# Patient Record
Sex: Male | Born: 1975 | Race: White | Hispanic: No | Marital: Married | Smoking: Former smoker
Health system: Southern US, Community
[De-identification: ages and names within clinical notes are randomized; demographics above are authoritative.]

## PROBLEM LIST (undated history)

## (undated) DIAGNOSIS — I10 Essential (primary) hypertension: Secondary | ICD-10-CM

## (undated) DIAGNOSIS — E785 Hyperlipidemia, unspecified: Secondary | ICD-10-CM

## (undated) HISTORY — PX: WRIST FRACTURE SURGERY: SHX121

## (undated) HISTORY — PX: KNEE ARTHROSCOPY: SUR90

---

## 2020-04-28 ENCOUNTER — Emergency Department (HOSPITAL_BASED_OUTPATIENT_CLINIC_OR_DEPARTMENT_OTHER)
Admission: EM | Admit: 2020-04-28 | Discharge: 2020-04-28 | Disposition: A | Discharge status: Home / Self Care | Payer: Managed Care, Other (non HMO) | Attending: Emergency Medicine | Admitting: Emergency Medicine

## 2020-04-28 ENCOUNTER — Encounter (HOSPITAL_BASED_OUTPATIENT_CLINIC_OR_DEPARTMENT_OTHER): Payer: Self-pay | Admitting: *Deleted

## 2020-04-28 ENCOUNTER — Emergency Department (HOSPITAL_BASED_OUTPATIENT_CLINIC_OR_DEPARTMENT_OTHER): Payer: Managed Care, Other (non HMO)

## 2020-04-28 ENCOUNTER — Other Ambulatory Visit: Payer: Self-pay

## 2020-04-28 DIAGNOSIS — R0602 Shortness of breath: Secondary | ICD-10-CM | POA: Diagnosis present

## 2020-04-28 DIAGNOSIS — R0981 Nasal congestion: Secondary | ICD-10-CM | POA: Insufficient documentation

## 2020-04-28 DIAGNOSIS — Z87891 Personal history of nicotine dependence: Secondary | ICD-10-CM | POA: Insufficient documentation

## 2020-04-28 DIAGNOSIS — I10 Essential (primary) hypertension: Secondary | ICD-10-CM | POA: Insufficient documentation

## 2020-04-28 DIAGNOSIS — R0600 Dyspnea, unspecified: Secondary | ICD-10-CM | POA: Diagnosis not present

## 2020-04-28 HISTORY — DX: Hyperlipidemia, unspecified: E78.5

## 2020-04-28 HISTORY — DX: Essential (primary) hypertension: I10

## 2020-04-28 LAB — COMPREHENSIVE METABOLIC PANEL
ALT: 54 U/L — ABNORMAL HIGH (ref 0–44)
AST: 56 U/L — ABNORMAL HIGH (ref 15–41)
Albumin: 4.6 g/dL (ref 3.5–5.0)
Alkaline Phosphatase: 87 U/L (ref 38–126)
Anion gap: 13 (ref 5–15)
BUN: 12 mg/dL (ref 6–20)
CO2: 25 mmol/L (ref 22–32)
Calcium: 9.4 mg/dL (ref 8.9–10.3)
Chloride: 101 mmol/L (ref 98–111)
Creatinine, Ser: 0.93 mg/dL (ref 0.61–1.24)
GFR calc Af Amer: >60 mL/min (ref >60)
GFR calc non Af Amer: >60 mL/min (ref >60)
Glucose, Bld: 114 mg/dL — ABNORMAL HIGH (ref 70–99)
Potassium: 4 mmol/L (ref 3.5–5.1)
Sodium: 139 mmol/L (ref 135–145)
Total Bilirubin: 0.5 mg/dL (ref 0.3–1.2)
Total Protein: 8.3 g/dL — ABNORMAL HIGH (ref 6.5–8.1)

## 2020-04-28 LAB — CBC WITH DIFFERENTIAL/PLATELET
Abs Immature Granulocytes: 0.04 10*3/uL (ref 0.00–0.07)
Basophils Absolute: 0.1 10*3/uL (ref 0.0–0.1)
Basophils Relative: 1 %
Eosinophils Absolute: 0 10*3/uL (ref 0.0–0.5)
Eosinophils Relative: 0 %
HCT: 43.5 % (ref 39.0–52.0)
Hemoglobin: 14.9 g/dL (ref 13.0–17.0)
Immature Granulocytes: 0 %
Lymphocytes Relative: 27 %
Lymphs Abs: 2.8 10*3/uL (ref 0.7–4.0)
MCH: 32.5 pg (ref 26.0–34.0)
MCHC: 34.3 g/dL (ref 30.0–36.0)
MCV: 94.8 fL (ref 80.0–100.0)
Monocytes Absolute: 0.8 10*3/uL (ref 0.1–1.0)
Monocytes Relative: 8 %
Neutro Abs: 6.4 10*3/uL (ref 1.7–7.7)
Neutrophils Relative %: 64 %
Platelets: 250 10*3/uL (ref 150–400)
RBC: 4.59 MIL/uL (ref 4.22–5.81)
RDW: 13.3 % (ref 11.5–15.5)
WBC: 10.2 10*3/uL (ref 4.0–10.5)
nRBC: 0 % (ref 0.0–0.2)

## 2020-04-28 LAB — BRAIN NATRIURETIC PEPTIDE: B Natriuretic Peptide: 22 pg/mL (ref 0.0–100.0)

## 2020-04-28 LAB — D-DIMER, QUANTITATIVE: D-Dimer, Quant: 0.36 ug/mL-FEU (ref 0.00–0.50)

## 2020-04-28 LAB — TROPONIN I (HIGH SENSITIVITY): Troponin I (High Sensitivity): 4 ng/L (ref <18)

## 2020-04-28 MED ORDER — ALBUTEROL SULFATE HFA 108 (90 BASE) MCG/ACT IN AERS: 1.0000 | INHALATION_SPRAY | Freq: Four times a day (QID) | RESPIRATORY_TRACT | 0 refills | Status: AC | PRN

## 2020-04-28 NOTE — ED Notes (Signed)
Patient transported to X-ray 

## 2020-04-28 NOTE — ED Triage Notes (Addendum)
Pt c/o SOb with exertion x 4 days, sent here by PMD  For PE eval

## 2020-04-28 NOTE — Discharge Instructions (Addendum)
Per our discussion, I would continue taking your loratadine once a day.  I would also purchase Flonase.  You can buy this over-the-counter.  You can also find the generic version which is much cheaper.  Take this once to twice per day in each nostril.  I would recommend following up with your primary care provider regarding your symptoms as well as visit.  If you find that your symptoms have not improved in 1 week you should give them a call to set up an appointment for further evaluation.  You can always return to the emergency department for reevaluation.  It was a pleasure to meet you.

## 2020-04-28 NOTE — ED Provider Notes (Signed)
[facility] EMERGENCY DEPARTMENT Provider Note   CSN: [id number] Arrival date & time: 04/28/20  1514     History Chief Complaint  Patient presents with  . Shortness of Breath    Jonathan Gillespie is a 44 y.o. male.  HPI Patient is a 44 year old male with a medical history as noted below.  Patient states for the last 2 to 3 days he has been experiencing very mild dyspnea on exertion.  He is very active and notes that when swimming or doing his martial arts class he felt "more winded than normal".  He denies any significant shortness of breath but states that "he just feels like he cannot take a full breath".  Last night he felt mildly congested and was having difficulty breathing through his nose.  He was concerned that he could have contracted COVID-19 and attempted to be seen by his PCP today but could not schedule an appointment, so he went to a local urgent care.  There was concern at this local urgent care that his symptoms could be consistent with a pulmonary embolism so he was sent to the emergency department.  He did have a rapid COVID-19 test which was negative.  Patient has no other complaints at this time.  No fevers, chills, chest pain, abdominal pain, syncope.     Past Medical History:  Diagnosis Date  . Hyperlipidemia   . Hypertension     There are no problems to display for this patient.   Past Surgical History:  Procedure Laterality Date  . KNEE ARTHROSCOPY    . WRIST FRACTURE SURGERY         History reviewed. No pertinent family history.  Social History   Tobacco Use  . Smoking status: Former Games developer  . Smokeless tobacco: Never Used  Substance Use Topics  . Alcohol use: Not Currently    Alcohol/week: 3.0 standard drinks    Types: 3 Cans of beer per week  . Drug use: Not Currently    Home Medications Prior to Admission medications   Not on File    Allergies    Patient has no known allergies.  Review of Systems   Review of Systems    Constitutional: Negative for chills and fever.  HENT: Positive for congestion. Negative for sinus pressure, sinus pain and sore throat.   Respiratory: Positive for shortness of breath.   Cardiovascular: Negative for chest pain and leg swelling.  Gastrointestinal: Negative for abdominal pain, nausea and vomiting.   Physical Exam Updated Vital Signs BP (!) 155/98   Pulse 91   Temp 98.8 F (37.1 C) (Oral)   Resp 18   Ht 5\' 6"  (1.676 m)   Wt 99.8 kg   SpO2 100%   BMI 35.51 kg/m   Physical Exam Vitals and nursing note reviewed.  Constitutional:      General: He is not in acute distress.    Appearance: Normal appearance. He is well-developed. He is not ill-appearing, toxic-appearing or diaphoretic.  HENT:     Head: Normocephalic and atraumatic.     Comments: Mildly edematous nasal turbinates noted bilaterally.  No congestion.    Right Ear: External ear normal.     Left Ear: External ear normal.     Nose: Nose normal.     Mouth/Throat:     Mouth: Mucous membranes are moist.     Pharynx: Oropharynx is clear. No oropharyngeal exudate or posterior oropharyngeal erythema.  Eyes:     Extraocular Movements: Extraocular movements intact.  Cardiovascular:     Rate and Rhythm: Normal rate and regular rhythm.     Pulses: Normal pulses.     Heart sounds: Normal heart sounds. No murmur heard.  No friction rub. No gallop.      Comments: Heart is regular rate and rhythm.  Patient is not tachycardic. Pulmonary:     Effort: Pulmonary effort is normal. No tachypnea, accessory muscle usage or respiratory distress.     Breath sounds: Normal breath sounds. No stridor. No decreased breath sounds, wheezing, rhonchi or rales.     Comments: Lungs are clear to auscultation bilaterally.  No tachypnea.  Patient is saturating at 99% while I am in the room and speaking clear in complete sentences. Abdominal:     General: Abdomen is flat.     Palpations: Abdomen is soft.  Musculoskeletal:         General: Normal range of motion.     Cervical back: Normal range of motion and neck supple. No tenderness.     Right lower leg: No tenderness. No edema.     Left lower leg: No tenderness. No edema.     Comments: No edema appreciated the bilateral lower extremities.  No calf pain.  Negative Homans' sign.  Skin:    General: Skin is warm and dry.  Neurological:     General: No focal deficit present.     Mental Status: He is alert and oriented to person, place, and time.  Psychiatric:        Mood and Affect: Mood normal.        Behavior: Behavior normal.    ED Results / Procedures / Treatments   Labs (all labs ordered are listed, but only abnormal results are displayed) Labs Reviewed  COMPREHENSIVE METABOLIC PANEL - Abnormal; Notable for the following components:      Result Value   Glucose, Bld 114 (*)    Total Protein 8.3 (*)    AST 56 (*)    ALT 54 (*)    All other components within normal limits  CBC WITH DIFFERENTIAL/PLATELET  D-DIMER, QUANTITATIVE (NOT AT [facility])  BRAIN NATRIURETIC PEPTIDE  TROPONIN I (HIGH SENSITIVITY)   EKG None  Radiology DG Chest 2 View  Result Date: 04/28/2020 CLINICAL DATA:  Sob on inspiration x 2 days Former smoker No sx/ ca Kizzie Furnish EXAM: CHEST - 2 VIEW COMPARISON:  None. FINDINGS: The heart size and mediastinal contours are within normal limits. Mild interstitial prominence bilaterally. No focal consolidation. No pneumothorax or pleural effusion. The visualized skeletal structures are unremarkable. IMPRESSION: Mild interstitial prominence bilaterally, which could represent chronic changes from prior smoking. Mild acute bronchitis not excluded. Electronically Signed   By: Emmaline Kluver M.D.   On: 04/28/2020 16:00    Procedures Procedures   Medications Ordered in ED Medications - No data to display  ED Course  I have reviewed the triage vital signs and the nursing notes.  Pertinent labs & imaging results that were available during my care of  the patient were reviewed by me and considered in my medical decision making (see chart for details).  Clinical Course as of Apr 28 1834  Wed Apr 28, 2020  1738 Troponin I (High Sensitivity): 4 [LJ]  1738 D-Dimer, Quant: 0.36 [LJ]  1738 B Natriuretic Peptide: 22.0 [LJ]  1758 AST(!): 56 [LJ]  1759 ALT(!): 54 [LJ]  1834 Mild interstitial prominence bilaterally, which could represent chronic changes from prior smoking. Mild acute bronchitis not excluded.  Patient is a former  smoker and reports cessation from smoking 20 years ago.  DG Chest 2 View [LJ]    Clinical Course User Index [LJ] Placido Sou, PA-C   MDM Rules/Calculators/A&P                          Pt is a 44 y.o. male that present with a history, physical exam, ED Clinical Course as noted above.   Patient presents today due to very mild DOE.  Patient was initially seen at an urgent care and sent to the emergency department for PE rule out.  Labs obtained in triage are extremely reassuring.  No elevation in his troponin.  No elevation his D-dimer.  No elevation in his BNP.  His vital signs are stable.  He is not hypoxic.  He is not tachycardic.  Patient does have a mildly elevated AST of 56 and ALT of 54.  Patient states he has been told in the past that he has elevated liver enzymes and is currently working on exercise and weight loss.  He does drink 3-4 beers per night.  We discussed cutting back on his alcohol consumption.  Patient was very anxious regarding his symptoms.  I did note some mild edema of his nasal turbinates bilaterally.  Patient currently take loratadine.  Recommended that he begin taking Flonase as well.  We will also prescribe him an albuterol inhaler to see if this helps with his symptoms.  He is planning on following up with his primary care provider regarding this visit and has a 59-month appointment scheduled in a few weeks.  His questions were answered and he was amicable at the time of discharge.  He  has been mildly hypertensive throughout his stay today but otherwise his vital signs are stable.  Patient discharged to home/self care.  Condition at discharge: Stable  Note: Portions of this report may have been transcribed using voice recognition software. Every effort was made to ensure accuracy; however, inadvertent computerized transcription errors may be present.   Final Clinical Impression(s) / ED Diagnoses Final diagnoses:  Dyspnea, unspecified type    Rx / DC Orders ED Discharge Orders         Ordered    albuterol (VENTOLIN HFA) 108 (90 Base) MCG/ACT inhaler  Every 6 hours PRN     Discontinue  Reprint     04/28/20 1829           Placido Sou, PA-C 04/28/20 1835    Raeford Razor, MD 04/29/20 1919

## 2021-04-22 IMAGING — DX DG CHEST 2V
2 series · 2 of 2 positions shown · non-contrast
Comparison: None.

CLINICAL DATA: Sob on inspiration x 2 days Former smoker No sx/ ca
/injury

EXAM:
CHEST - 2 VIEW

[chest pa]
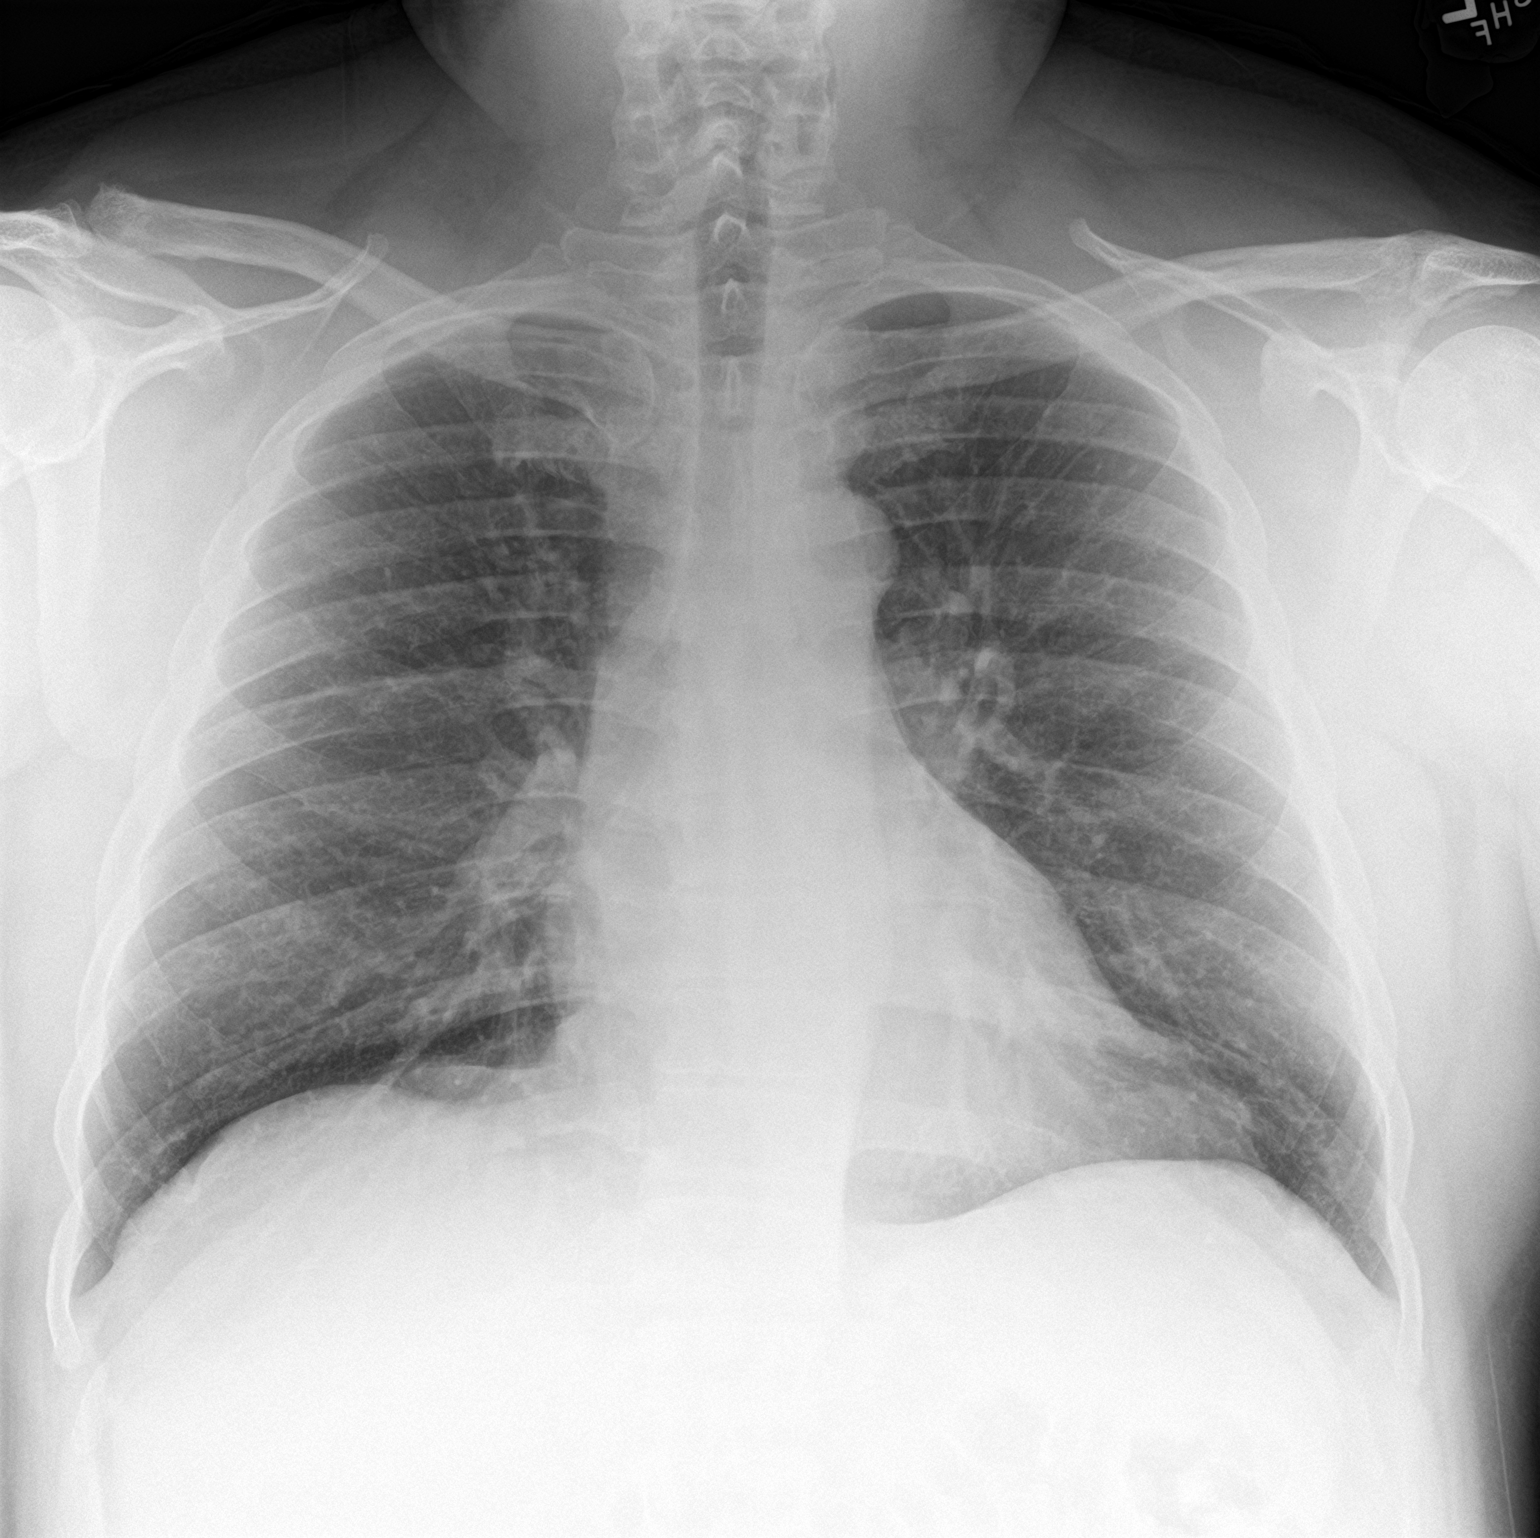

[chest lat]
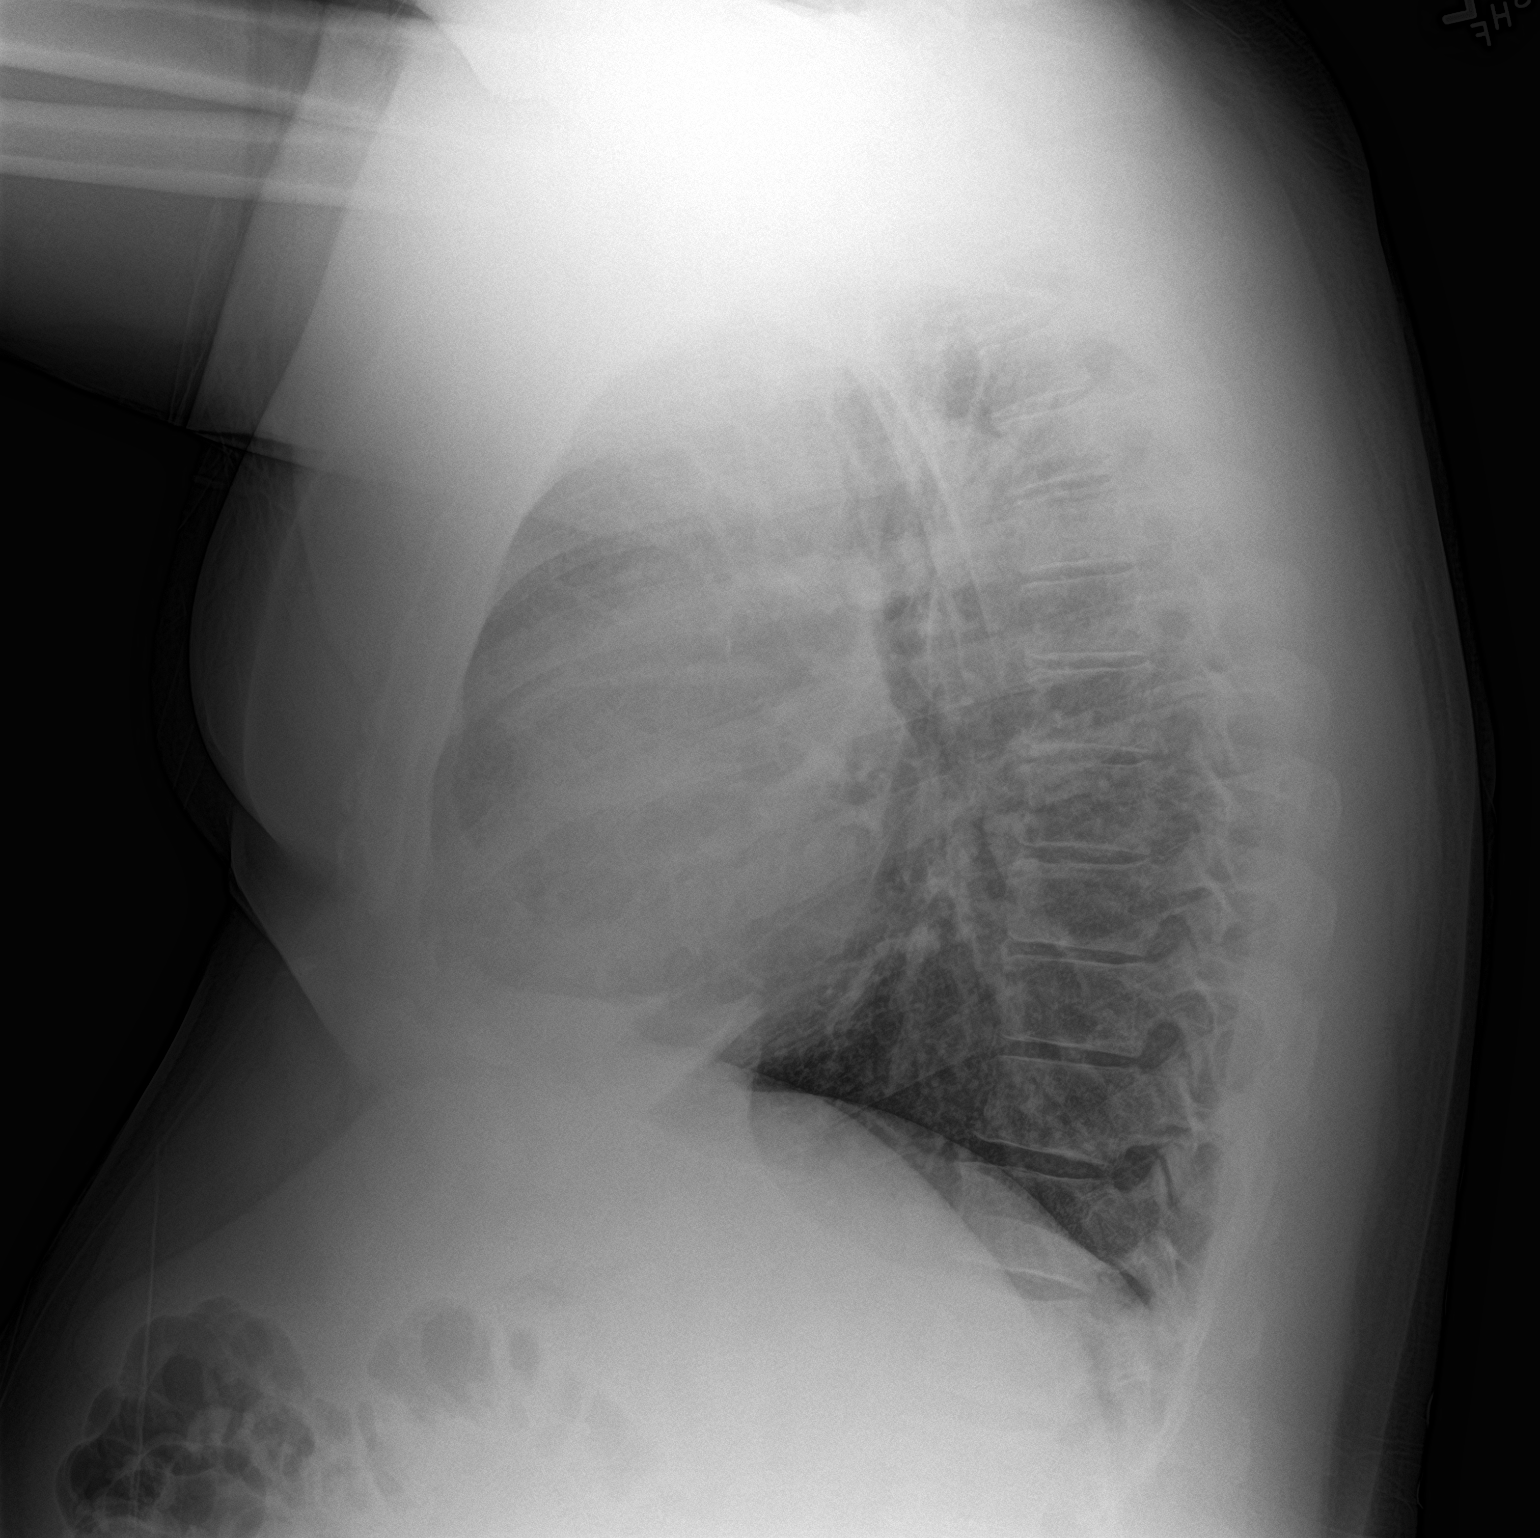

[2 of 2 positions shown; findings below may reference images not displayed]

FINDINGS: The heart size and mediastinal contours are within normal limits.
Mild interstitial prominence bilaterally. No focal consolidation. No
pneumothorax or pleural effusion. The visualized skeletal structures
are unremarkable.
IMPRESSION: Mild interstitial prominence bilaterally, which could represent
chronic changes from prior smoking. Mild acute bronchitis not
excluded.
